# Patient Record
Sex: Female | Born: 1965 | Race: White | Hispanic: No | Marital: Married
Health system: Southern US, Community
[De-identification: ages and names within clinical notes are randomized; demographics above are authoritative.]

## PROBLEM LIST (undated history)

## (undated) HISTORY — PX: TOE SURGERY: SHX1073

## (undated) HISTORY — PX: CYSTECTOMY: SUR359

## (undated) HISTORY — PX: BREAST SURGERY: SHX581

---

## 2015-02-18 ENCOUNTER — Ambulatory Visit (INDEPENDENT_AMBULATORY_CARE_PROVIDER_SITE_OTHER): Payer: 59 | Admitting: Podiatry

## 2015-02-18 ENCOUNTER — Ambulatory Visit (INDEPENDENT_AMBULATORY_CARE_PROVIDER_SITE_OTHER): Payer: 59

## 2015-02-18 ENCOUNTER — Encounter: Payer: Self-pay | Admitting: Podiatry

## 2015-02-18 VITALS — BP 108/55 | HR 70 | Resp 16 | Ht 66.0 in | Wt 139.0 lb

## 2015-02-18 DIAGNOSIS — M79674 Pain in right toe(s): Secondary | ICD-10-CM

## 2015-02-18 DIAGNOSIS — B351 Tinea unguium: Secondary | ICD-10-CM

## 2015-02-18 DIAGNOSIS — M2041 Other hammer toe(s) (acquired), right foot: Secondary | ICD-10-CM

## 2015-02-18 NOTE — Progress Notes (Signed)
   Subjective:    Patient ID: Linda Morton, female    DOB: 02-02-1966, 49 y.o.   MRN: [id number]  HPI Patient presents with a callous on right foot, pinky toe. Has had for past 10 years and now pt stated, "toe hurts when wear shoes"; Has tried callous remover with some relief.  Patient also presents with toe pain, right foot, pinky toe. This has been going on since April 2016.  Patient also presents with a bilateral nail problem. Great toes, pt stated, "thinks has fungus on nails after had a pedicare". Pedicare was done in 2011.   Review of Systems  All other systems reviewed and are negative.      Objective:   Physical Exam        Assessment & Plan:

## 2015-02-18 NOTE — Patient Instructions (Signed)
Pre-Operative Instructions  Congratulations, you have decided to take an important step to improving your quality of life.  You can be assured that the doctors of [facility] will be with you every step of the way.  1. Plan to be at the surgery center/hospital at least 1 (one) hour prior to your scheduled time unless otherwise directed by the surgical center/hospital staff.  You must have a responsible adult accompany you, remain during the surgery and drive you home.  Make sure you have directions to the surgical center/hospital and know how to get there on time. 2. For hospital based surgery you will need to obtain a history and physical form from your family physician within 1 month prior to the date of surgery- we will give you a form for you primary physician.  3. We make every effort to accommodate the date you request for surgery.  There are however, times where surgery dates or times have to be moved.  We will contact you as soon as possible if a change in schedule is required.   4. No Aspirin/Ibuprofen for one week before surgery.  If you are on aspirin, any non-steroidal anti-inflammatory medications (Mobic, Aleve, Ibuprofen) you should stop taking it 7 days prior to your surgery.  You make take Tylenol  For pain prior to surgery.  5. Medications- If you are taking daily heart and blood pressure medications, seizure, reflux, allergy, asthma, anxiety, pain or diabetes medications, make sure the surgery center/hospital is aware before the day of surgery so they may notify you which medications to take or avoid the day of surgery. 6. No food or drink after midnight the night before surgery unless directed otherwise by surgical center/hospital staff. 7. No alcoholic beverages 24 hours prior to surgery.  No smoking 24 hours prior to or 24 hours after surgery. 8. Wear loose pants or shorts- loose enough to fit over bandages, boots, and casts. 9. No slip on shoes, sneakers are best. 10. Bring  your boot with you to the surgery center/hospital.  Also bring crutches or a walker if your physician has prescribed it for you.  If you do not have this equipment, it will be provided for you after surgery. 11. If you have not been contracted by the surgery center/hospital by the day before your surgery, call to confirm the date and time of your surgery. 12. Leave-time from work may vary depending on the type of surgery you have.  Appropriate arrangements should be made prior to surgery with your employer. 13. Prescriptions will be provided immediately following surgery by your doctor.  Have these filled as soon as possible after surgery and take the medication as directed. 14. Remove nail polish on the operative foot. 15. Wash the night before surgery.  The night before surgery wash the foot and leg well with the antibacterial soap provided and water paying special attention to beneath the toenails and in between the toes.  Rinse thoroughly with water and dry well with a towel.  Perform this wash unless told not to do so by your physician.  Enclosed: 1 Ice pack (please put in freezer the night before surgery)   1 Hibiclens skin cleaner   Pre-op Instructions  If you have any questions regarding the instructions, do not hesitate to call our office.  [place]: [address] [place], [place] [address] [phone]  [place]: [address] [place], [place] [address] [phone]  [place]: [address]  [place], [place] [address] [phone]  Dr. Richard   Tuchman DPM, Dr. Tanielle Emigh DPM Dr. Richard Sikora DPM, Dr. M. Todd Hyatt DPM, Dr. Kathryn Egerton DPM 

## 2015-02-19 NOTE — Progress Notes (Signed)
Subjective:     Patient ID: Linda Morton, female   DOB: 12-11-1965, 49 y.o.   MRN: [id number]  HPI patient presents with a painful fifth toe right foot that is making it increasingly difficult to wear shoe gear with. Also complains of nails that are discolored and she wanted to have checked. States this toe has been on and off and seems to be getting gradually worse with pain   Review of Systems  All other systems reviewed and are negative.      Objective:   Physical Exam  Constitutional: She is oriented to person, place, and time.  Cardiovascular: Intact distal pulses.   Musculoskeletal: Normal range of motion.  Neurological: She is oriented to person, place, and time.  Skin: Skin is warm.  Nursing note and vitals reviewed.  neurovascular status intact muscle strength adequate with range of motion within normal limits. Patient's noted to have a rotated right fifth toe with keratotic lesion and pain that she tries to trim has tried to pad and has tried to modify her shoe gear with. Patient states that also she has discolored nails on the hallux bilateral that I noted and are localized in nature     Assessment:     Hammertoe deformity fifth right with pain and redness and nail disease with fungus    Plan:     H&P and x-ray reviewed which indicates enlargement of the head of the proximal phalanx fifth toe right with rotation. Discussed condition and treatment options and she wants to go ahead and get it fixed and I have recommended arthroplasty for the fifth toe. She does come here for a ways away like to do consent form today and I did allow her to read consent form for arthroplasty fifth toe right explaining the surgery risk and alternative treatments. She has tried all alternative treatments and would like to get it fixed and is scheduled for outpatient surgery and also we'll begin formula 3 for her nails. Understands total recovery take around 6 months to one year

## 2015-02-20 ENCOUNTER — Telehealth: Payer: Self-pay | Admitting: *Deleted

## 2015-02-20 NOTE — Telephone Encounter (Signed)
I left a message for the patient to return my call to reschedule appointment.  I need to reschedule her surgery date from 03/12/2015.

## 2015-02-21 ENCOUNTER — Telehealth: Payer: Self-pay | Admitting: *Deleted

## 2015-02-21 NOTE — Telephone Encounter (Signed)
Pt states Dr. Charlsie Merles said her toe may be flimsy after the surgery, and she didn't understand what that meant.  I encouraged pt to reappt with Dr. Charlsie Merles to discuss questions, and pt agreed and I transferred to schedulers.

## 2015-02-21 NOTE — Telephone Encounter (Signed)
I'm calling to see if we can reschedule your surgery from 03/12/2015 to 03/19/2015.  Dr. Charlsie Merles doesn't have anything available on 08/30.  "Sure, that will be fine."  He can do it on 03/19/15.  "That will be fine.  I have a question for Dr. Charlsie Merles or the nurse.  Could you please have them to call me?  He mentioned something about me maybe having a flimsy toe.  I want to know a little more about that."

## 2015-02-25 ENCOUNTER — Ambulatory Visit (INDEPENDENT_AMBULATORY_CARE_PROVIDER_SITE_OTHER): Payer: 59 | Admitting: Podiatry

## 2015-02-25 DIAGNOSIS — M2041 Other hammer toe(s) (acquired), right foot: Secondary | ICD-10-CM

## 2015-02-25 NOTE — Progress Notes (Signed)
Subjective:     Patient ID: Linda Morton, female   DOB: 06-24-1966, 49 y.o.   MRN: [id number]  HPI patient presents with questions concerning repair of her chronic hammertoe deformity fifth right   Review of Systems     Objective:   Physical Exam Neurovascular status intact with keratotic lesion fifth digit right that upon palpation is very tender when pressed with inability to trim or pad with any degree of relief    Assessment:     Chronic hammertoe deformity fifth digit right foot    Plan:     Reviewed condition with patient and discussed complications that can occur. There is nothing that I can take out but I do think that she will get a good result and since she's in so much pain we don't really have another choice at this time. Patient agrees with me and after extensive discussion she is comfortable with the surgical procedure necessary

## 2015-03-12 ENCOUNTER — Telehealth: Payer: Self-pay | Admitting: *Deleted

## 2015-03-12 NOTE — Telephone Encounter (Signed)
I faxed authorization number to Aram Beecham at [facility].  Authorization number is [id number] for Hammer Toe Repair 5th right.  Surgery scheduled for 03/19/2015.

## 2015-03-19 ENCOUNTER — Encounter: Payer: Self-pay | Admitting: *Deleted

## 2015-03-19 DIAGNOSIS — M2041 Other hammer toe(s) (acquired), right foot: Secondary | ICD-10-CM | POA: Diagnosis not present

## 2015-03-20 ENCOUNTER — Telehealth: Payer: Self-pay | Admitting: *Deleted

## 2015-03-20 NOTE — Telephone Encounter (Signed)
I left patient a message that I was calling to see how she was doing.  Call me back if you'd like.

## 2015-03-20 NOTE — Progress Notes (Signed)
Surgery performed at [facility], Hammer Toe Repair 5th digit right foot.  Prescription was written for Vicodin 10/325, take one by mouth every 4-6 hours as needed for pain, quantity 20, 0 refills.

## 2015-03-21 ENCOUNTER — Telehealth: Payer: Self-pay | Admitting: *Deleted

## 2015-03-21 NOTE — Telephone Encounter (Signed)
Pt states she was walking in the boot, and putting up a blanket on a chair and she felt and heard a pop in the foot around the little toe, and it hurt.  I told pt I felt she would benefit from seeing Dr. Charlsie Merles tomorrow and transferred her to schedulers.

## 2015-03-22 ENCOUNTER — Ambulatory Visit (INDEPENDENT_AMBULATORY_CARE_PROVIDER_SITE_OTHER): Payer: 59

## 2015-03-22 ENCOUNTER — Ambulatory Visit (INDEPENDENT_AMBULATORY_CARE_PROVIDER_SITE_OTHER): Payer: 59 | Admitting: Podiatry

## 2015-03-22 VITALS — BP 93/53 | HR 65 | Temp 98.3°F | Resp 16

## 2015-03-22 DIAGNOSIS — Z9889 Other specified postprocedural states: Secondary | ICD-10-CM

## 2015-03-22 DIAGNOSIS — M2041 Other hammer toe(s) (acquired), right foot: Secondary | ICD-10-CM | POA: Diagnosis not present

## 2015-03-25 NOTE — Progress Notes (Signed)
Subjective:     Patient ID: Linda Morton, female   DOB: July 30, 1965, 49 y.o.   MRN: [id number]  HPI patient states I had a pop in my toe and I was really worried I did something to it   Review of Systems     Objective:   Physical Exam Neurovascular status intact muscle strength adequate negative Homans sign noted and upon removal of dressing wound edges that are well coapted fifth digit right    Assessment:     Postop hammertoe repair fifth right with possible trauma    Plan:     X-rays reviewed with patient sterile dressing reapplied instructed on wider shoes and reappoint for suture removal in approximately 10 days

## 2015-03-28 ENCOUNTER — Other Ambulatory Visit: Payer: Self-pay

## 2015-04-01 ENCOUNTER — Ambulatory Visit (INDEPENDENT_AMBULATORY_CARE_PROVIDER_SITE_OTHER): Payer: 59 | Admitting: Podiatry

## 2015-04-01 ENCOUNTER — Ambulatory Visit: Payer: Self-pay

## 2015-04-01 DIAGNOSIS — Z9889 Other specified postprocedural states: Secondary | ICD-10-CM

## 2015-04-01 DIAGNOSIS — M2041 Other hammer toe(s) (acquired), right foot: Secondary | ICD-10-CM

## 2015-04-03 NOTE — Progress Notes (Signed)
Subjective:     Patient ID: Linda Morton, female   DOB: 1965-12-17, 49 y.o.   MRN: [id number]  HPI patient states I'm doing well with my fifth toe   Review of Systems     Objective:   Physical Exam Neurovascular status intact with digit in good alignment wound edges well coapted stitches in place    Assessment:     Doing well post arthroplasty fifth digit right    Plan:     Stitches removed wound edges coapted with good alignment of the toe and advised on gradual return soft shoe gear over the next 4 weeks and to reappoint as needed

## 2015-04-12 ENCOUNTER — Other Ambulatory Visit: Payer: Self-pay

## 2016-07-02 ENCOUNTER — Encounter (HOSPITAL_COMMUNITY): Payer: Self-pay

## 2016-07-02 ENCOUNTER — Inpatient Hospital Stay (HOSPITAL_COMMUNITY)
Admission: AD | Admit: 2016-07-02 | Discharge: 2016-07-02 | Disposition: A | Discharge status: Home / Self Care | Payer: 59 | Source: Ambulatory Visit | Attending: Obstetrics and Gynecology | Admitting: Obstetrics and Gynecology

## 2016-07-02 DIAGNOSIS — N9489 Other specified conditions associated with female genital organs and menstrual cycle: Secondary | ICD-10-CM

## 2016-07-02 DIAGNOSIS — Z885 Allergy status to narcotic agent status: Secondary | ICD-10-CM | POA: Diagnosis not present

## 2016-07-02 DIAGNOSIS — N939 Abnormal uterine and vaginal bleeding, unspecified: Secondary | ICD-10-CM | POA: Diagnosis not present

## 2016-07-02 LAB — URINALYSIS, ROUTINE W REFLEX MICROSCOPIC
Bilirubin Urine: NEGATIVE
Glucose, UA: NEGATIVE mg/dL
Hgb urine dipstick: NEGATIVE
Ketones, ur: 5 mg/dL — AB
Leukocytes, UA: NEGATIVE
NITRITE: NEGATIVE
Protein, ur: NEGATIVE mg/dL
SPECIFIC GRAVITY, URINE: 1.031 — AB (ref 1.005–1.030)
pH: 5 (ref 5.0–8.0)

## 2016-07-02 LAB — CBC
HEMATOCRIT: 31.8 % — AB (ref 36.0–46.0)
Hemoglobin: 10.1 g/dL — ABNORMAL LOW (ref 12.0–15.0)
MCH: 29 pg (ref 26.0–34.0)
MCHC: 31.8 g/dL (ref 30.0–36.0)
MCV: 91.4 fL (ref 78.0–100.0)
Platelets: 401 10*3/uL — ABNORMAL HIGH (ref 150–400)
RBC: 3.48 MIL/uL — ABNORMAL LOW (ref 3.87–5.11)
RDW: 14.3 % (ref 11.5–15.5)
WBC: 15.1 10*3/uL — ABNORMAL HIGH (ref 4.0–10.5)

## 2016-07-02 LAB — POCT PREGNANCY, URINE: PREG TEST UR: NEGATIVE

## 2016-07-02 MED ORDER — KETOROLAC TROMETHAMINE 30 MG/ML IJ SOLN
30.0000 mg | Freq: Once | INTRAMUSCULAR | Status: AC
  Administered 2016-07-02: 30 mg via INTRAMUSCULAR
  Filled 2016-07-02: qty 1

## 2016-07-02 MED ORDER — KETOROLAC TROMETHAMINE 10 MG PO TABS: 10.0000 mg | ORAL_TABLET | Freq: Four times a day (QID) | ORAL | 0 refills | Status: AC | PRN

## 2016-07-02 NOTE — MAU Provider Note (Signed)
History     CSN: [id number]  Arrival date and time: 07/02/16 1520   First Provider Initiated Contact with Patient 07/02/16 1604      Chief Complaint  Patient presents with  . Vaginal Bleeding  . Abdominal Pain   Non-pregnant female here with heavy vaginal bleeding and cramping. She is using super tampon and changing q1 hour and passing 2 inch clots. She has used Tylenol for the cramping with no relief. She has been followed in the office for AUB and given Provera and Megace with little to no improvement. There is a third medicine she used but cannot remember the name. She reports US 2 weeks ago that showed fibroids and "fluid build up on the ovary". She is to follow up for US in 6 weeks.     History reviewed. No pertinent past medical history.  Past Surgical History:  Procedure Laterality Date  . BREAST SURGERY    . CESAREAN SECTION    . CYSTECTOMY    . TOE SURGERY      History reviewed. No pertinent family history.  Social History  Substance Use Topics  . Smoking status: Not on file  . Smokeless tobacco: Not on file  . Alcohol use Not on file    Allergies:  Allergies  Allergen Reactions  . Codeine Nausea And Vomiting  . Tape Hives    Surgical tape    No prescriptions prior to admission.    Review of Systems  Constitutional: Negative.   Gastrointestinal: Positive for abdominal pain.  Neurological: Positive for dizziness.   Physical Exam   Blood pressure 125/55, pulse 76, temperature 98.2 F (36.8 C), temperature source Oral, resp. rate 18, height 5\' 6"  (1.676 m), weight 65.2 kg (143 lb 12 oz), SpO2 100 %.  Physical Exam  Constitutional: She is oriented to person, place, and time. She appears well-developed and well-nourished. No distress.  HENT:  Head: Normocephalic and atraumatic.  Neck: Normal range of motion. Neck supple.  Cardiovascular: Normal rate.   Respiratory: Effort normal.  GI: Soft. She exhibits no distension and no mass. There is no  tenderness. There is no rebound and no guarding.  Genitourinary:  Genitourinary Comments: External: no lesions or erythema Vagina: rugated, parous/ nulli, small thick drk red blood from os  Uterus: slightly enlarged, anteverted, mildly tender, no CMT Adnexae: no masses, mod tenderness left, no tenderness right   Musculoskeletal: Normal range of motion.  Neurological: She is alert and oriented to person, place, and time.  Skin: Skin is warm and dry.  Psychiatric: She has a normal mood and affect.   Results for orders placed or performed during the hospital encounter of 07/02/16 (from the past 24 hour(s))  Urinalysis, Routine w reflex microscopic     Status: Abnormal   Collection Time: 07/02/16  4:07 PM  Result Value Ref Range   Color, Urine AMBER (A) YELLOW   APPearance HAZY (A) CLEAR   Specific Gravity, Urine 1.031 (H) 1.005 - 1.030   pH 5.0 5.0 - 8.0   Glucose, UA NEGATIVE NEGATIVE mg/dL   Hgb urine dipstick NEGATIVE NEGATIVE   Bilirubin Urine NEGATIVE NEGATIVE   Ketones, ur 5 (A) NEGATIVE mg/dL   Protein, ur NEGATIVE NEGATIVE mg/dL   Nitrite NEGATIVE NEGATIVE   Leukocytes, UA NEGATIVE NEGATIVE  Pregnancy, urine POC     Status: None   Collection Time: 07/02/16  4:13 PM  Result Value Ref Range   Preg Test, Ur NEGATIVE NEGATIVE  CBC  Status: Abnormal   Collection Time: 07/02/16  4:41 PM  Result Value Ref Range   WBC 15.1 (H) 4.0 - 10.5 K/uL   RBC 3.48 (L) 3.87 - 5.11 MIL/uL   Hemoglobin 10.1 (L) 12.0 - 15.0 g/dL   HCT 78.431.8 (L) 69.636.0 - 29.546.0 %   MCV 91.4 78.0 - 100.0 fL   MCH 29.0 26.0 - 34.0 pg   MCHC 31.8 30.0 - 36.0 g/dL   RDW 28.414.3 13.211.5 - 44.015.5 %   Platelets 401 (H) 150 - 400 K/uL    MAU Course  Procedures Toradol 30 mg IM  MDM Labs ordered and reviewed. Presentation, clinical findings, and plan discussed with Dr. Normand Sloopillard. Stable for discharge home.  Assessment and Plan   1. Abnormal uterine bleeding (AUB)   2. Uterine cramping    Discharge home Follow up  with Dr. Estanislado Pandyivard in 2 weeks Stop Megace Start OCP taper (sent from office today)  Allergies as of 07/02/2016      Reactions   Codeine Nausea And Vomiting   Tape Hives   Surgical tape      Medication List    TAKE these medications   ketorolac 10 MG tablet Commonly known as:  TORADOL Take 1 tablet (10 mg total) by mouth every 6 (six) hours as needed.       Donette LarryMelanie Brylon Brenning, CNM 07/02/2016, 4:08 PM

## 2016-07-02 NOTE — Discharge Instructions (Signed)
Abnormal Uterine Bleeding Abnormal uterine bleeding can affect women at various stages in life, including teenagers, women in their reproductive years, pregnant women, and women who have reached menopause. Several kinds of uterine bleeding are considered abnormal, including:  Bleeding or spotting between periods.  Bleeding after sexual intercourse.  Bleeding that is heavier or more than normal.  Periods that last longer than usual.  Bleeding after menopause. Many cases of abnormal uterine bleeding are minor and simple to treat, while others are more serious. Any type of abnormal bleeding should be evaluated by your health care provider. Treatment will depend on the cause of the bleeding. Follow these instructions at home: Monitor your condition for any changes. The following actions may help to alleviate any discomfort you are experiencing:  Avoid the use of tampons and douches as directed by your health care provider.  Change your pads frequently. You should get regular pelvic exams and Pap tests. Keep all follow-up appointments for diagnostic tests as directed by your health care provider. Contact a health care provider if:  Your bleeding lasts more than 1 week.  You feel dizzy at times. Get help right away if:  You pass out.  You are changing pads every 15 to 30 minutes.  You have abdominal pain.  You have a fever.  You become sweaty or weak.  You are passing large blood clots from the vagina.  You start to feel nauseous and vomit. This information is not intended to replace advice given to you by your health care provider. Make sure you discuss any questions you have with your health care provider. Document Released: 06/29/2005 Document Revised: 12/11/2015 Document Reviewed: 01/26/2013 Elsevier Interactive Patient Education  2017 Elsevier Inc.  

## 2016-07-02 NOTE — MAU Note (Signed)
Pt sent over from MD office due to heavy vaginal bleeding and abdominal pain. Pt states she has had heavy bleeding since around Thanksgiving and was place on a medication to help with the bleeding but the bleeding has continued and gotten worse.

## 2021-09-17 ENCOUNTER — Other Ambulatory Visit: Payer: Self-pay | Admitting: Gastroenterology

## 2021-09-17 DIAGNOSIS — R1031 Right lower quadrant pain: Secondary | ICD-10-CM

## 2021-09-25 ENCOUNTER — Other Ambulatory Visit: Payer: Self-pay

## 2021-09-25 ENCOUNTER — Ambulatory Visit
Admission: RE | Admit: 2021-09-25 | Discharge: 2021-09-25 | Disposition: A | Discharge status: Home / Self Care | Payer: 59 | Source: Ambulatory Visit | Attending: Gastroenterology | Admitting: Gastroenterology

## 2021-09-25 MED ORDER — IOPAMIDOL (ISOVUE-300) INJECTION 61%: 100.0000 mL | Freq: Once | INTRAVENOUS | Status: DC | PRN

## 2021-09-25 MED ORDER — IOPAMIDOL (ISOVUE-300) INJECTION 61%
100.0000 mL | Freq: Once | INTRAVENOUS | Status: AC | PRN
  Administered 2021-09-25: 100 mL via INTRAVENOUS

## 2023-02-13 IMAGING — CT CT ABD-PELV W/ CM
1 of 3 series · 13 of 32 positions shown, 18 images · IV contrast (agent unspecified)
Comparison: None.

CLINICAL DATA: Right lower quadrant pain and tenderness x3 months.

EXAM:
CT ABDOMEN AND PELVIS WITH CONTRAST
TECHNIQUE: Multidetector CT imaging of the abdomen and pelvis was performed
using the standard protocol following bolus administration of
intravenous contrast.

[Series 2: a/p w/ 5mm · axial · 0.91mm/px · z∈[-455,-5]mm · 13 of 101 slices shown, 18 images]
[im 6/101  soft-tissue]
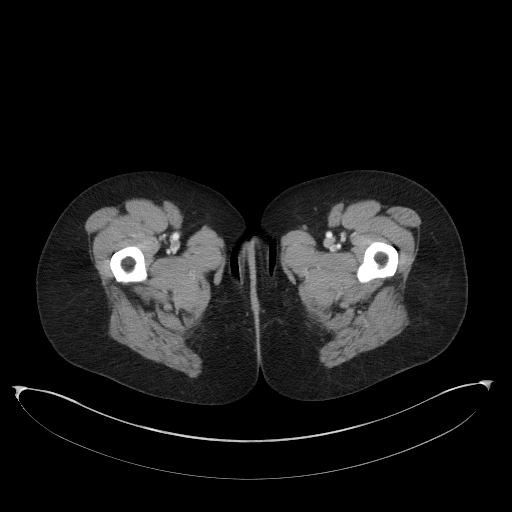
[im 6/101  bone]
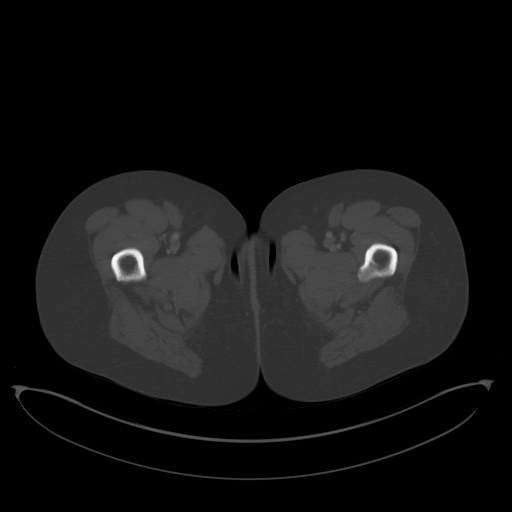
[im 16/101  soft-tissue]
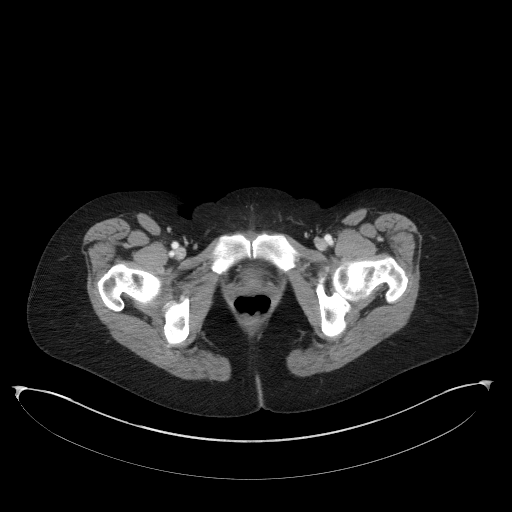
[im 21/101  soft-tissue]
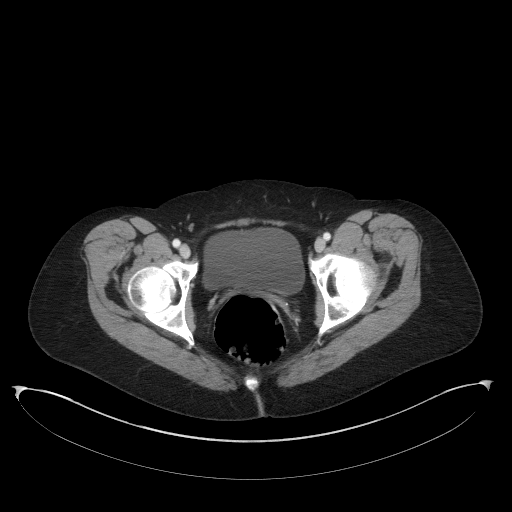
[im 31/101  soft-tissue]
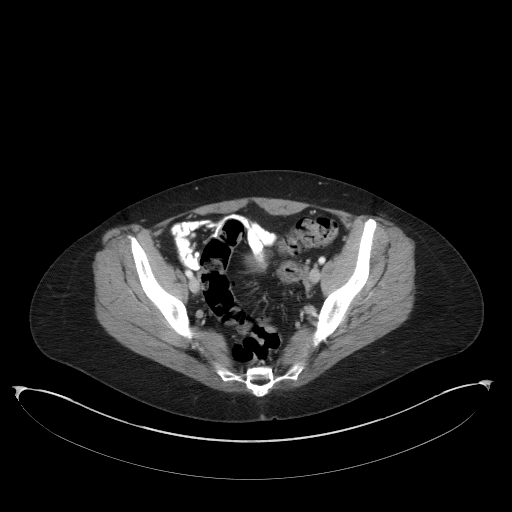
[im 41/101  soft-tissue]
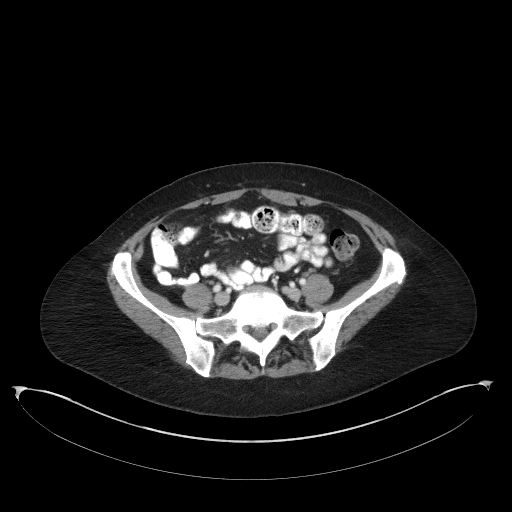
[im 46/101  soft-tissue]
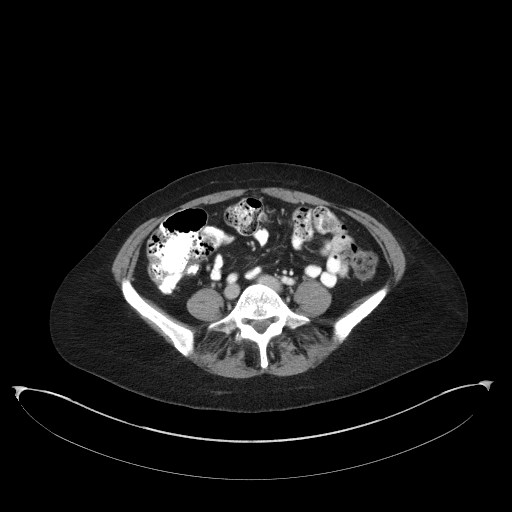
[im 56/101  soft-tissue]
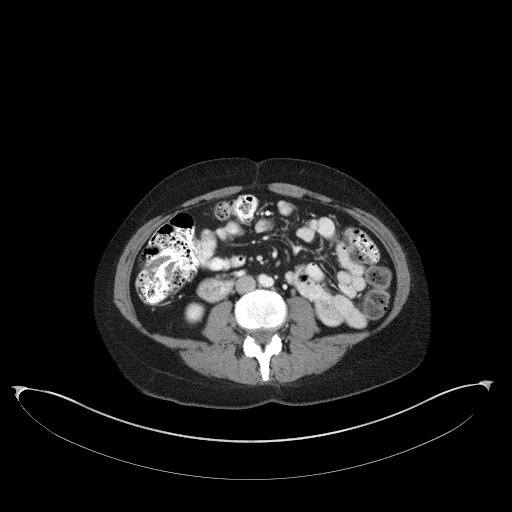
[im 61/101  soft-tissue]
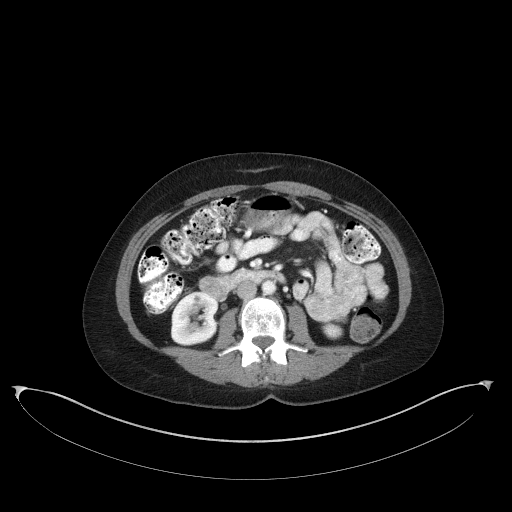
[im 71/101  soft-tissue]
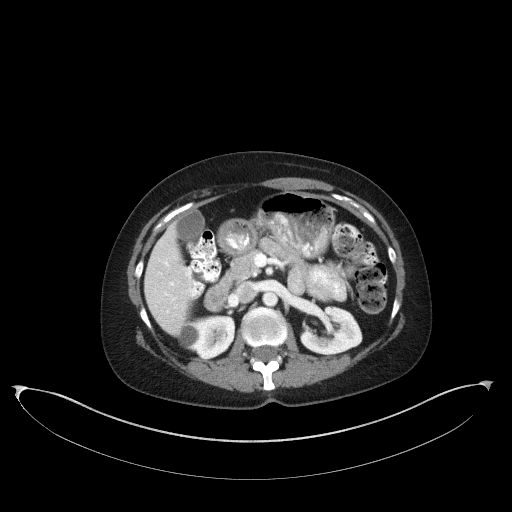
[im 71/101  bone]
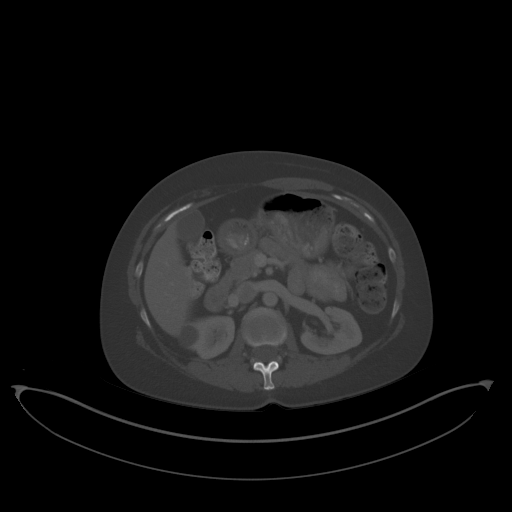
[im 81/101  soft-tissue]
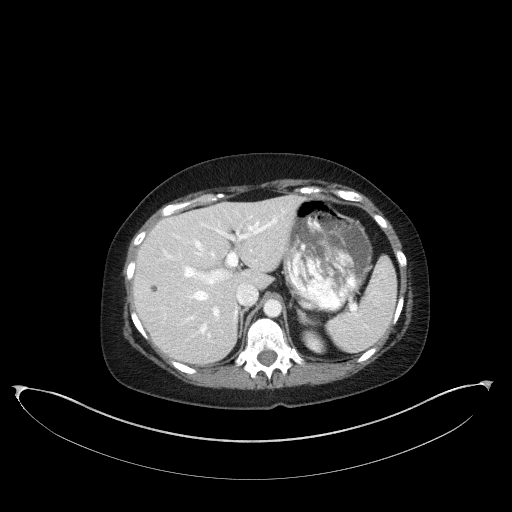
[im 81/101  lung]
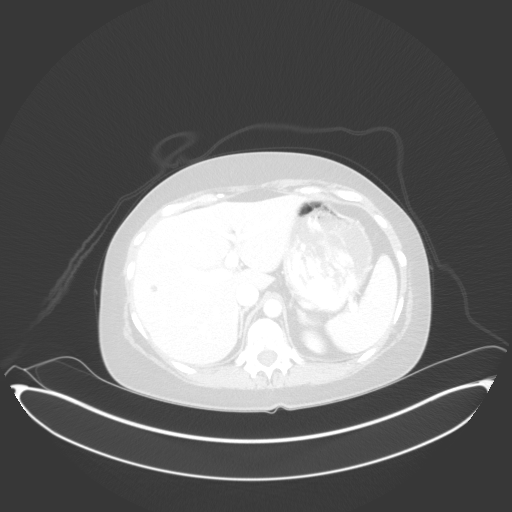
[im 86/101  soft-tissue]
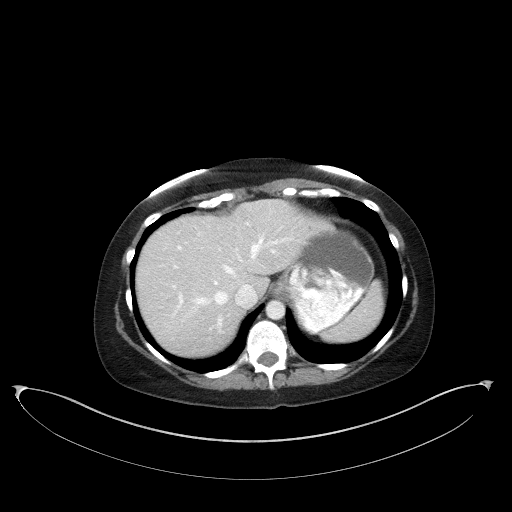
[im 86/101  lung]
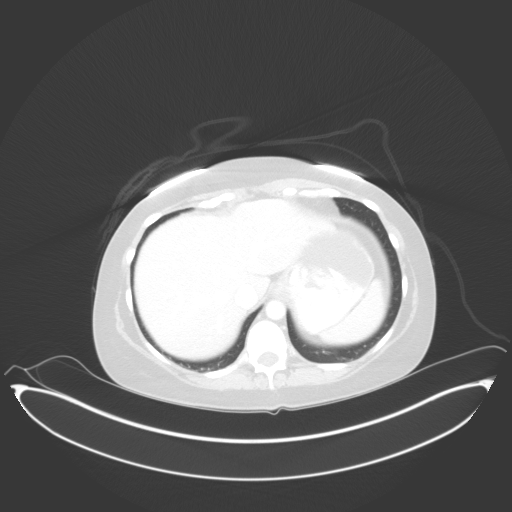
[im 91/101  lung]
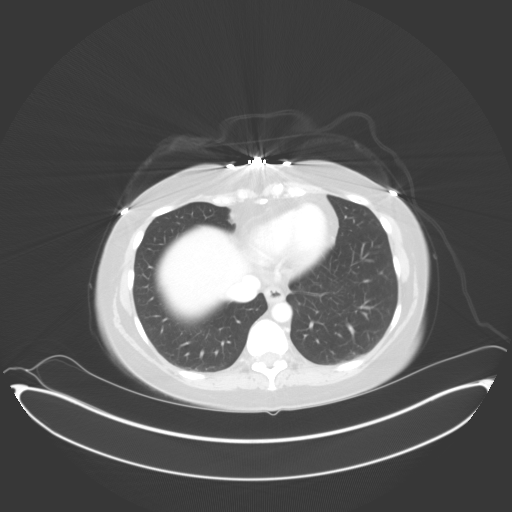
[im 96/101  soft-tissue]
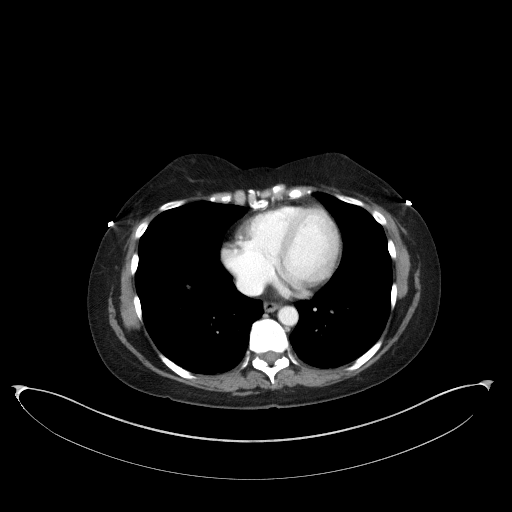
[im 96/101  lung]
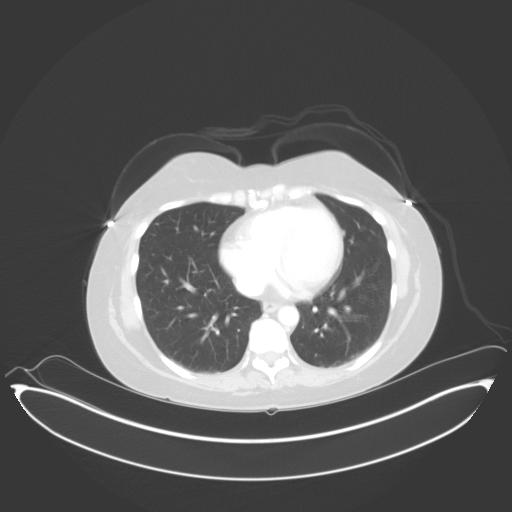

[13 of 32 positions shown; findings below may reference images not displayed]

RADIATION DOSE REDUCTION: This exam was performed according to the
departmental dose-optimization program which includes automated
exposure control, adjustment of the mA and/or kV according to
patient size and/or use of iterative reconstruction technique.

CONTRAST:  100mL ANFQSP-MRR IOPAMIDOL (ANFQSP-MRR) INJECTION 61%
FINDINGS: Lower chest: No acute abnormality.

Hepatobiliary: Bilobar hypodense hepatic lesions are technically too
small to accurately characterize but statistically likely reflect a
benign etiology such as cysts or hemangiomas. Gallbladder is
unremarkable. No biliary ductal dilation.

Pancreas: No pancreatic ductal dilation or evidence of acute
inflammation.

Spleen: No splenomegaly or focal splenic lesion.

Adrenals/Urinary Tract: Bilateral adrenal glands appear normal.
Hypodense 2.2 cm right renal cyst. Additional subcentimeter
hypodense right renal lesions too small to accurately characterize
but statistically likely to reflect a cyst. Kidneys demonstrate
symmetric enhancement and excretion of contrast material. Urinary
bladder is unremarkable for degree of distension.

Stomach/Bowel: Radiopaque enteric contrast material traverses the
hepatic flexure. Small hiatal hernia otherwise the stomach is
unremarkable. No pathologic dilation of small or large bowel. The
appendix and terminal ileum appear normal. Large volume of formed
stool in the colon suggestive of constipation. No evidence of acute
bowel inflammation.

Vascular/Lymphatic: Aortic atherosclerosis without aneurysmal
dilation. No pathologically enlarged abdominal or pelvic lymph
nodes.

Reproductive: Uterus and bilateral adnexa are unremarkable.

Other: No significant abdominopelvic free fluid.

Musculoskeletal: No acute or significant osseous findings.
IMPRESSION: 1. No acute abnormality in the abdomen or pelvis.
2. Large volume of formed stool in the colon suggestive of
constipation.
3. Small hiatal hernia.
4.  Aortic Atherosclerosis ([id number]-1SY.Y).
